# Patient Record
Sex: Female | Born: 1990 | Race: White | Hispanic: Yes | Marital: Single | Smoking: Never smoker
Health system: Southern US, Community
[De-identification: ages and names within clinical notes are randomized; demographics above are authoritative.]

## PROBLEM LIST (undated history)

## (undated) ENCOUNTER — Emergency Department (HOSPITAL_COMMUNITY): Payer: No Typology Code available for payment source | Source: Home / Self Care

## (undated) HISTORY — PX: OTHER SURGICAL HISTORY: SHX169

---
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
## 2012-09-02 ENCOUNTER — Encounter (HOSPITAL_COMMUNITY): Payer: Self-pay | Admitting: Emergency Medicine

## 2012-09-02 ENCOUNTER — Emergency Department (HOSPITAL_COMMUNITY)
Admission: EM | Admit: 2012-09-02 | Discharge: 2012-09-02 | Disposition: A | Discharge status: Home / Self Care | Payer: Self-pay | Attending: Emergency Medicine | Admitting: Emergency Medicine

## 2012-09-02 DIAGNOSIS — N644 Mastodynia: Secondary | ICD-10-CM | POA: Insufficient documentation

## 2012-09-02 DIAGNOSIS — R079 Chest pain, unspecified: Secondary | ICD-10-CM | POA: Insufficient documentation

## 2012-09-02 DIAGNOSIS — Z3202 Encounter for pregnancy test, result negative: Secondary | ICD-10-CM | POA: Insufficient documentation

## 2012-09-02 LAB — POCT PREGNANCY, URINE: Preg Test, Ur: NEGATIVE

## 2012-09-02 MED ORDER — IBUPROFEN 800 MG PO TABS: 800.0000 mg | ORAL_TABLET | Freq: Three times a day (TID) | ORAL | Status: AC | PRN
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
## 2012-09-02 NOTE — ED Notes (Signed)
Pt c/o pain below left breast onset this am.

## 2012-09-02 NOTE — ED Notes (Signed)
C/o L breast pain that is worse with palpation since waking up at 8am.  Denies any other symptoms.

## 2012-09-03 NOTE — ED Provider Notes (Signed)
Medical screening examination/treatment/procedure(s) were performed by non-physician practitioner and as supervising physician I was immediately available for consultation/collaboration.  Dawnmarie Breon, MD 09/03/12 1425 

## 2015-03-03 ENCOUNTER — Other Ambulatory Visit: Payer: Self-pay | Admitting: Infectious Disease

## 2015-03-03 ENCOUNTER — Ambulatory Visit
Admission: RE | Admit: 2015-03-03 | Discharge: 2015-03-03 | Disposition: A | Discharge status: Home / Self Care | Payer: No Typology Code available for payment source | Source: Ambulatory Visit | Attending: Infectious Disease | Admitting: Infectious Disease

## 2015-03-03 DIAGNOSIS — R7611 Nonspecific reaction to tuberculin skin test without active tuberculosis: Secondary | ICD-10-CM

## 2019-01-11 ENCOUNTER — Other Ambulatory Visit: Payer: Self-pay

## 2019-01-11 DIAGNOSIS — Z20822 Contact with and (suspected) exposure to covid-19: Secondary | ICD-10-CM

## 2019-01-12 LAB — NOVEL CORONAVIRUS, NAA: SARS-CoV-2, NAA: NOT DETECTED

## 2019-03-05 ENCOUNTER — Emergency Department (HOSPITAL_BASED_OUTPATIENT_CLINIC_OR_DEPARTMENT_OTHER)
Admission: EM | Admit: 2019-03-05 | Discharge: 2019-03-05 | Disposition: A | Discharge status: Home / Self Care | Payer: Self-pay | Attending: Emergency Medicine | Admitting: Emergency Medicine

## 2019-03-05 ENCOUNTER — Other Ambulatory Visit: Payer: Self-pay

## 2019-03-05 ENCOUNTER — Encounter (HOSPITAL_BASED_OUTPATIENT_CLINIC_OR_DEPARTMENT_OTHER): Payer: Self-pay | Admitting: Emergency Medicine

## 2019-03-05 DIAGNOSIS — M545 Low back pain: Secondary | ICD-10-CM | POA: Diagnosis not present

## 2019-03-05 DIAGNOSIS — Y92410 Unspecified street and highway as the place of occurrence of the external cause: Secondary | ICD-10-CM | POA: Insufficient documentation

## 2019-03-05 DIAGNOSIS — Y999 Unspecified external cause status: Secondary | ICD-10-CM | POA: Insufficient documentation

## 2019-03-05 DIAGNOSIS — R1030 Lower abdominal pain, unspecified: Secondary | ICD-10-CM | POA: Insufficient documentation

## 2019-03-05 DIAGNOSIS — Z3202 Encounter for pregnancy test, result negative: Secondary | ICD-10-CM | POA: Diagnosis not present

## 2019-03-05 DIAGNOSIS — Y9389 Activity, other specified: Secondary | ICD-10-CM | POA: Diagnosis not present

## 2019-03-05 DIAGNOSIS — M7918 Myalgia, other site: Secondary | ICD-10-CM

## 2019-03-05 LAB — URINALYSIS, MICROSCOPIC (REFLEX)

## 2019-03-05 LAB — URINALYSIS, ROUTINE W REFLEX MICROSCOPIC
Bilirubin Urine: NEGATIVE
Glucose, UA: NEGATIVE mg/dL
Ketones, ur: NEGATIVE mg/dL
Leukocytes,Ua: NEGATIVE
Nitrite: NEGATIVE
Protein, ur: NEGATIVE mg/dL
Specific Gravity, Urine: 1.025 (ref 1.005–1.030)
pH: 6 (ref 5.0–8.0)

## 2019-03-05 LAB — PREGNANCY, URINE: Preg Test, Ur: NEGATIVE

## 2019-03-05 MED ORDER — METHOCARBAMOL 500 MG PO TABS: 1000.0000 mg | ORAL_TABLET | Freq: Four times a day (QID) | ORAL | 0 refills | Status: AC
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
## 2019-09-10 ENCOUNTER — Ambulatory Visit: Payer: Self-pay | Attending: Internal Medicine

## 2019-09-10 DIAGNOSIS — Z23 Encounter for immunization: Secondary | ICD-10-CM

## 2019-09-10 NOTE — Progress Notes (Signed)
   Covid-19 Vaccination Clinic  Name:  Jennifer Hudson    MRN: [id number] DOB: 06/20/1990  09/10/2019  Ms. Jennifer Hudson was observed post Covid-19 immunization for 15 minutes without incident. She was provided with Vaccine Information Sheet and instruction to access the V-Safe system.   Ms. Jennifer Hudson was instructed to call 911 with any severe reactions post vaccine: Marland Kitchen Difficulty breathing  . Swelling of face and throat  . A fast heartbeat  . A bad rash all over body  . Dizziness and weakness   Immunizations Administered    Name Date Dose VIS Date Route   Pfizer COVID-19 Vaccine 09/10/2019  1:21 PM 0.3 mL 05/28/2019 Intramuscular   Manufacturer: [employer], [employer]   Lot: [id number]   NDC: [address]-2

## 2019-10-01 ENCOUNTER — Ambulatory Visit: Payer: Self-pay | Attending: Internal Medicine

## 2019-10-01 ENCOUNTER — Ambulatory Visit: Payer: Self-pay

## 2019-10-01 DIAGNOSIS — Z23 Encounter for immunization: Secondary | ICD-10-CM

## 2019-10-01 NOTE — Progress Notes (Signed)
   Covid-19 Vaccination Clinic  Name:  Jennifer Hudson    MRN: [id number] DOB: April 12, 1991  10/01/2019  Ms. [name] was observed post Covid-19 immunization for 15 minutes without incident. She was provided with Vaccine Information Sheet and instruction to access the V-Safe system.   Ms. [name] was instructed to call 911 with any severe reactions post vaccine: Marland Kitchen Difficulty breathing  . Swelling of face and throat  . A fast heartbeat  . A bad rash all over body  . Dizziness and weakness   Immunizations Administered    Name Date Dose VIS Date Route   Pfizer COVID-19 Vaccine 10/01/2019  2:20 PM 0.3 mL 05/28/2019 Intramuscular   Manufacturer: [employer], [employer]   Lot: [id number]   NDC: [address]-4

## 2020-02-15 ENCOUNTER — Other Ambulatory Visit: Payer: Self-pay

## 2020-02-15 ENCOUNTER — Encounter (HOSPITAL_COMMUNITY): Payer: Self-pay

## 2020-02-15 ENCOUNTER — Emergency Department (HOSPITAL_COMMUNITY)
Admission: EM | Admit: 2020-02-15 | Discharge: 2020-02-15 | Disposition: A | Discharge status: Home / Self Care | Payer: Self-pay | Attending: Emergency Medicine | Admitting: Emergency Medicine

## 2020-02-15 DIAGNOSIS — J069 Acute upper respiratory infection, unspecified: Secondary | ICD-10-CM | POA: Insufficient documentation

## 2020-02-15 DIAGNOSIS — R638 Other symptoms and signs concerning food and fluid intake: Secondary | ICD-10-CM | POA: Insufficient documentation

## 2020-02-15 DIAGNOSIS — R509 Fever, unspecified: Secondary | ICD-10-CM | POA: Insufficient documentation

## 2020-02-15 NOTE — ED Provider Notes (Signed)
[facility]-EMERGENCY DEPT Provider Note   CSN: [id number] Arrival date & time: 02/15/20  1436     History No chief complaint on file.   Jennifer Hudson is a 29 y.o. female.  HPI Patient presents with URI symptoms. Has had for around 2 days. Got seen at the health department at [facility] for a test. States the test not back yet. Does have possible Covid exposure. Has had her vaccine more than 2 weeks ago. Mild sore throat. Some chills. States she has lost her sense of taste. Increasing fatigue. States she wants to sleep a lot. Denies possibly of pregnancy. Denies dysuria. States she is healthy and exercises.    History reviewed. No pertinent past medical history.  There are no problems to display for this patient.   Past Surgical History:  Procedure Laterality Date  . toe nail removal       OB History   No obstetric history on file.     Family History  Problem Relation Age of Onset  . Hypertension Mother   . Diabetes Mother   . Hypertension Father   . Diabetes Father     Social History   Tobacco Use  . Smoking status: Never Smoker  . Smokeless tobacco: Never Used  Vaping Use  . Vaping Use: Never used  Substance Use Topics  . Alcohol use: Yes    Comment: occasionally  . Drug use: Yes    Types: Marijuana    Home Medications Prior to Admission medications   Medication Sig Start Date End Date Taking? Authorizing Provider  ibuprofen (ADVIL,MOTRIN) 800 MG tablet Take 1 tablet (800 mg total) by mouth every 8 (eight) hours as needed for pain. 09/02/12   Fayrene Helper, PA-C  methocarbamol (ROBAXIN) 500 MG tablet Take 2 tablets (1,000 mg total) by mouth 4 (four) times daily. 03/05/19   Renne Crigler, PA-C    Allergies    Patient has no known allergies.  Review of Systems   Review of Systems  Constitutional: Positive for appetite change, fatigue and fever.  HENT: Negative for congestion.   Respiratory: Positive for cough.   Gastrointestinal:  Negative for abdominal pain, diarrhea, nausea and vomiting.  Genitourinary: Negative for flank pain.  Musculoskeletal: Negative for back pain.  Neurological: Negative for weakness.  Psychiatric/Behavioral: Negative for confusion.    Physical Exam Updated Vital Signs BP 130/81 (BP Location: Right Arm)   Pulse (!) 101   Temp 98 F (36.7 C) (Oral)   Resp 20   Ht 5\' 1"  (1.549 m)   Wt 69.9 kg   LMP 11/15/2019 Comment: Patient has an irregular period  SpO2 99%   BMI 29.10 kg/m   Physical Exam Vitals and nursing note reviewed.  HENT:     Head: Normocephalic.     Mouth/Throat:     Comments: Mild posterior pharyngeal erythema without exudate. Cardiovascular:     Comments: Mild tachycardia Pulmonary:     Effort: Pulmonary effort is normal.     Breath sounds: Normal breath sounds. No wheezing or rhonchi.  Abdominal:     Tenderness: There is no abdominal tenderness.  Musculoskeletal:        General: No tenderness.     Cervical back: Neck supple.  Skin:    General: Skin is warm.  Neurological:     Mental Status: She is alert and oriented to person, place, and time.     ED Results / Procedures / Treatments   Labs (all labs ordered are listed,  but only abnormal results are displayed) Labs Reviewed - No data to display  EKG None  Radiology No results found.  Procedures Procedures (including critical care time)  Medications Ordered in ED Medications - No data to display  ED Course  I have reviewed the triage vital signs and the nursing notes.  Pertinent labs & imaging results that were available during my care of the patient were reviewed by me and considered in my medical decision making (see chart for details).    MDM Rules/Calculators/A&P                          Patient with URI symptoms. Has had outpatient Covid test but not resulted. Having more fatigue. Lungs clear. Doubt focal bacterial pneumonia. Potentially could be Covid infection although testing has  been done results are not back yet. Not think she needs a separate test here. Will treat as if she has Covid now with isolation. Will discharge home. Potentially could have other viral infection but treatment is the same. Final Clinical Impression(s) / ED Diagnoses Final diagnoses:  Upper respiratory tract infection, unspecified type    Rx / DC Orders ED Discharge Orders    None       Benjiman Core, MD 02/15/20 1747
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
## 2020-02-15 NOTE — Discharge Instructions (Addendum)
You likely have a viral infection.  It could be Covid.  Isolate yourself now like it is an known Covid infection.  Return for worsening difficulty breathing.  Follow-up with the health department for the results of your test.

## 2021-08-11 ENCOUNTER — Emergency Department (HOSPITAL_COMMUNITY): Payer: Self-pay

## 2021-08-11 ENCOUNTER — Emergency Department (HOSPITAL_COMMUNITY)
Admission: EM | Admit: 2021-08-11 | Discharge: 2021-08-12 | Disposition: A | Discharge status: Home / Self Care | Payer: Self-pay | Attending: Emergency Medicine | Admitting: Emergency Medicine

## 2021-08-11 DIAGNOSIS — Y93E8 Activity, other personal hygiene: Secondary | ICD-10-CM | POA: Insufficient documentation

## 2021-08-11 DIAGNOSIS — S61411A Laceration without foreign body of right hand, initial encounter: Secondary | ICD-10-CM | POA: Insufficient documentation

## 2021-08-11 DIAGNOSIS — W25XXXA Contact with sharp glass, initial encounter: Secondary | ICD-10-CM | POA: Insufficient documentation

## 2021-08-11 NOTE — ED Triage Notes (Signed)
Pt c/o lac to R hand between thumb & index finger, states she was cleaning out a bag of clothes & cut it on broken glass. Bleeding controlled in triage

## 2021-08-12 MED ORDER — LIDOCAINE-EPINEPHRINE-TETRACAINE (LET) TOPICAL GEL
3.0000 mL | Freq: Once | TOPICAL | Status: AC
Start: 2021-08-12 — End: 2021-08-12
  Administered 2021-08-12: 3 mL via TOPICAL
  Filled 2021-08-12: qty 3

## 2021-08-12 NOTE — ED Provider Notes (Signed)
[facility] EMERGENCY DEPARTMENT Provider Note   CSN: [id number] Arrival date & time: 08/11/21  2312     History  Chief Complaint  Patient presents with   Laceration    Jennifer Hudson is a 31 y.o. female.   Laceration Location:  Hand Hand laceration location: ~1 cm laceration between thumb and pointer finger, hemostatic. Length:  1 cm Depth:  Cutaneous Quality: jagged   Bleeding: controlled   Laceration mechanism:  Broken glass Pain details:    Quality:  Aching   Severity:  Mild   Timing:  Constant   Progression:  Improving Foreign body present:  No foreign bodies Relieved by:  Nothing Worsened by:  Nothing Ineffective treatments:  None tried Tetanus status:  Up to date     Home Medications Prior to Admission medications   Medication Sig Start Date End Date Taking? Authorizing Provider  ibuprofen (ADVIL,MOTRIN) 800 MG tablet Take 1 tablet (800 mg total) by mouth every 8 (eight) hours as needed for pain. 09/02/12   Fayrene Helper, PA-C  methocarbamol (ROBAXIN) 500 MG tablet Take 2 tablets (1,000 mg total) by mouth 4 (four) times daily. 03/05/19   Renne Crigler, PA-C      Allergies    Patient has no known allergies.    Review of Systems   Review of Systems  Physical Exam Updated Vital Signs BP (!) 131/102 (BP Location: Left Arm)    Pulse 73    Temp 98.6 F (37 C) (Oral)    Resp 18    SpO2 99%  Physical Exam Vitals and nursing note reviewed.  Constitutional:      Appearance: She is well-developed.  HENT:     Head: Normocephalic and atraumatic.     Nose: Nose normal. No congestion or rhinorrhea.     Mouth/Throat:     Mouth: Mucous membranes are moist.  Eyes:     Pupils: Pupils are equal, round, and reactive to light.  Cardiovascular:     Rate and Rhythm: Normal rate and regular rhythm.  Pulmonary:     Effort: No respiratory distress.     Breath sounds: No stridor.  Abdominal:     General: Abdomen is flat. There is no  distension.  Musculoskeletal:        General: No swelling or tenderness. Normal range of motion.     Cervical back: Normal range of motion.  Skin:    General: Skin is warm and dry.     Comments: ~1 cm laceration between thumb and pointer finger, hemostatic  Neurological:     General: No focal deficit present.     Mental Status: She is alert.    ED Results / Procedures / Treatments   Labs (all labs ordered are listed, but only abnormal results are displayed) Labs Reviewed - No data to display  EKG None  Radiology DG Hand Complete Right  Result Date: 08/11/2021 CLINICAL DATA:  Right hand injury. EXAM: RIGHT HAND - COMPLETE 3+ VIEW COMPARISON:  None. FINDINGS: There is no acute fracture or dislocation. 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 No arthritic changes. The soft tissues are unremarkable. No radiopaque foreign object or soft tissue gas. IMPRESSION: No acute fracture or dislocation. Electronically Signed   By: Elgie Collard M.D.   On: 08/11/2021 23:46    Procedures .Marland KitchenLaceration Repair  Date/Time: 08/12/2021 4:20 AM Performed by: Marily Memos, MD Authorized by: Marily Memos, MD   Consent:    Consent obtained:  Verbal   Consent given by:  Patient   Risks discussed:  Infection, need for additional repair, nerve damage, poor wound healing, poor cosmetic result, pain, retained foreign body, tendon damage and vascular damage   Alternatives discussed:  No treatment, delayed treatment and observation Universal protocol:    Procedure explained and questions answered to patient or proxy's satisfaction: yes     Relevant documents present and verified: yes     Site/side marked: yes     Patient identity confirmed:  Hospital-assigned identification number and arm band Anesthesia:    Anesthesia method:  Topical application   Topical anesthetic:  LET Laceration details:    Location:  Hand   Hand location:  R palm   Length (cm):  1   Depth (mm):  3 Pre-procedure details:     Preparation:  Patient was prepped and draped in usual sterile fashion and imaging obtained to evaluate for foreign bodies Exploration:    Wound exploration: wound explored through full range of motion   Treatment:    Area cleansed with:  Saline   Amount of cleaning:  Extensive   Irrigation solution:  Sterile water   Irrigation method:  Syringe   Debridement:  None   Scar revision: no   Skin repair:    Repair method:  Sutures   Suture size:  4-0   Suture material:  Prolene   Suture technique:  Simple interrupted   Number of sutures:  2 Approximation:    Approximation:  Close Repair type:    Repair type:  Simple Post-procedure details:    Dressing:  Antibiotic ointment, non-adherent dressing, tube gauze and bulky dressing   Procedure completion:  Tolerated well, no immediate complications    Medications Ordered in ED Medications  lidocaine-EPINEPHrine-tetracaine (LET) topical gel (3 mLs Topical Given 08/12/21 0150)    ED Course/ Medical Decision Making/ A&P                           Medical Decision Making Amount and/or Complexity of Data Reviewed Radiology: ordered.   XR without foreign body. Wound repaired as above. States tetanus UTD. Will return for suture removal or earlier if signs of infection.   Final Clinical Impression(s) / ED Diagnoses Final diagnoses:  Laceration of right hand, foreign body presence unspecified, initial encounter    Rx / DC Orders ED Discharge Orders     None         Brendan Gadson, Barbara Cower, MD 08/12/21 [phone]

## 2023-09-25 IMAGING — CR DG HAND COMPLETE 3+V*R*
3 series · 3 of 3 positions shown · non-contrast
Comparison: None.

CLINICAL DATA: Right hand injury.

EXAM:
RIGHT HAND - COMPLETE 3+ VIEW

[hand pa]
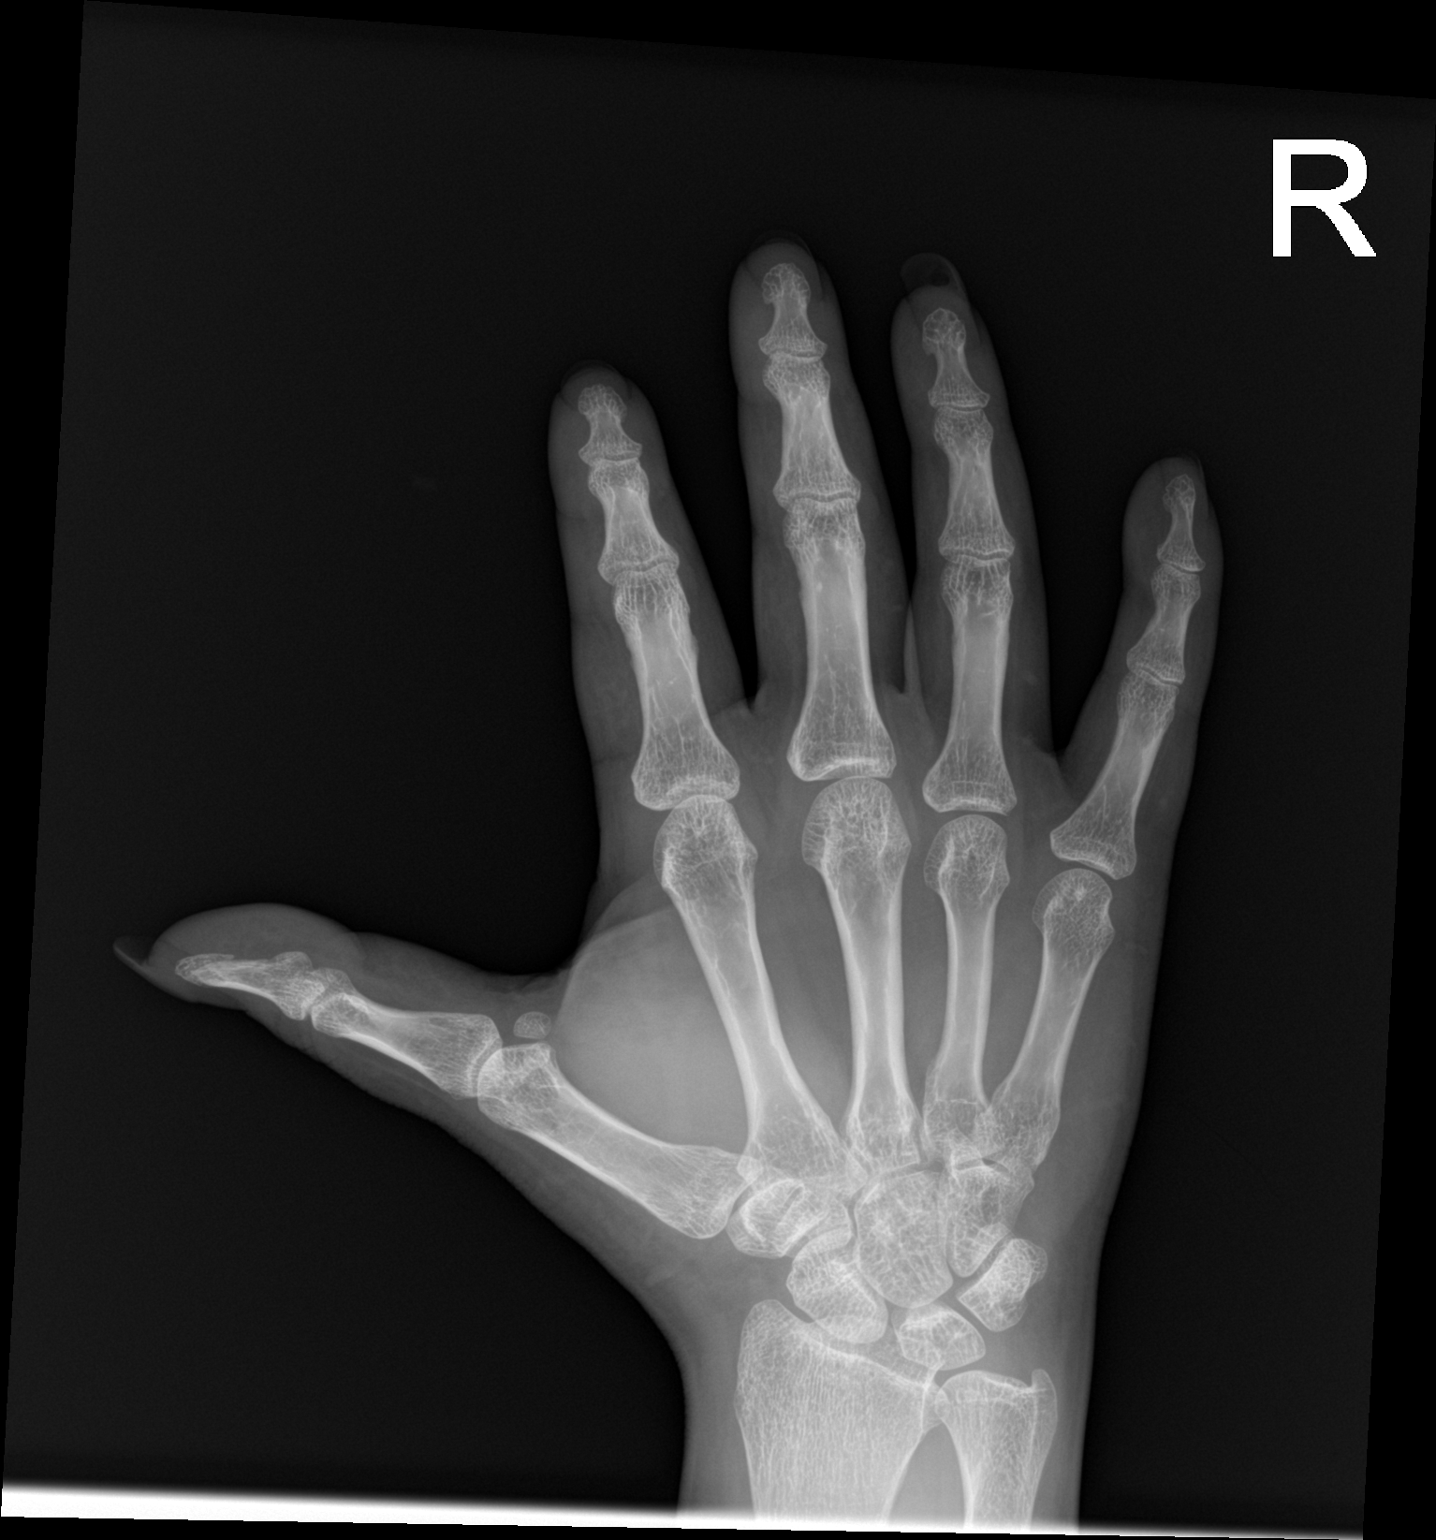

[hand obl]
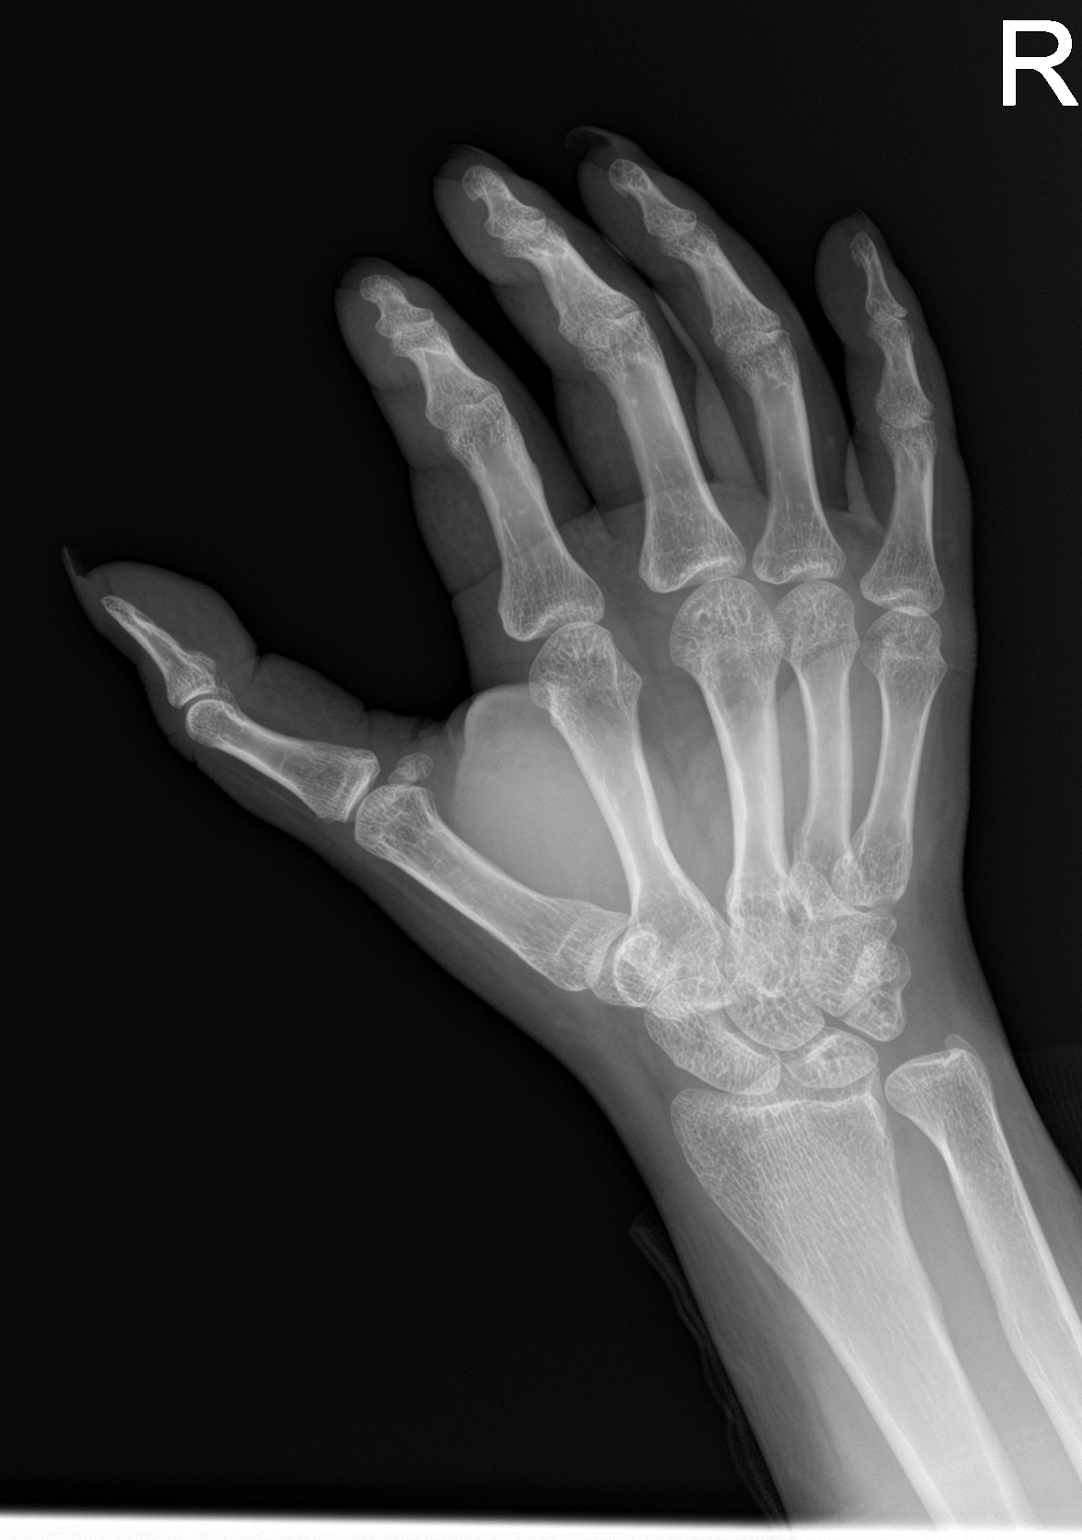

[hand lat]
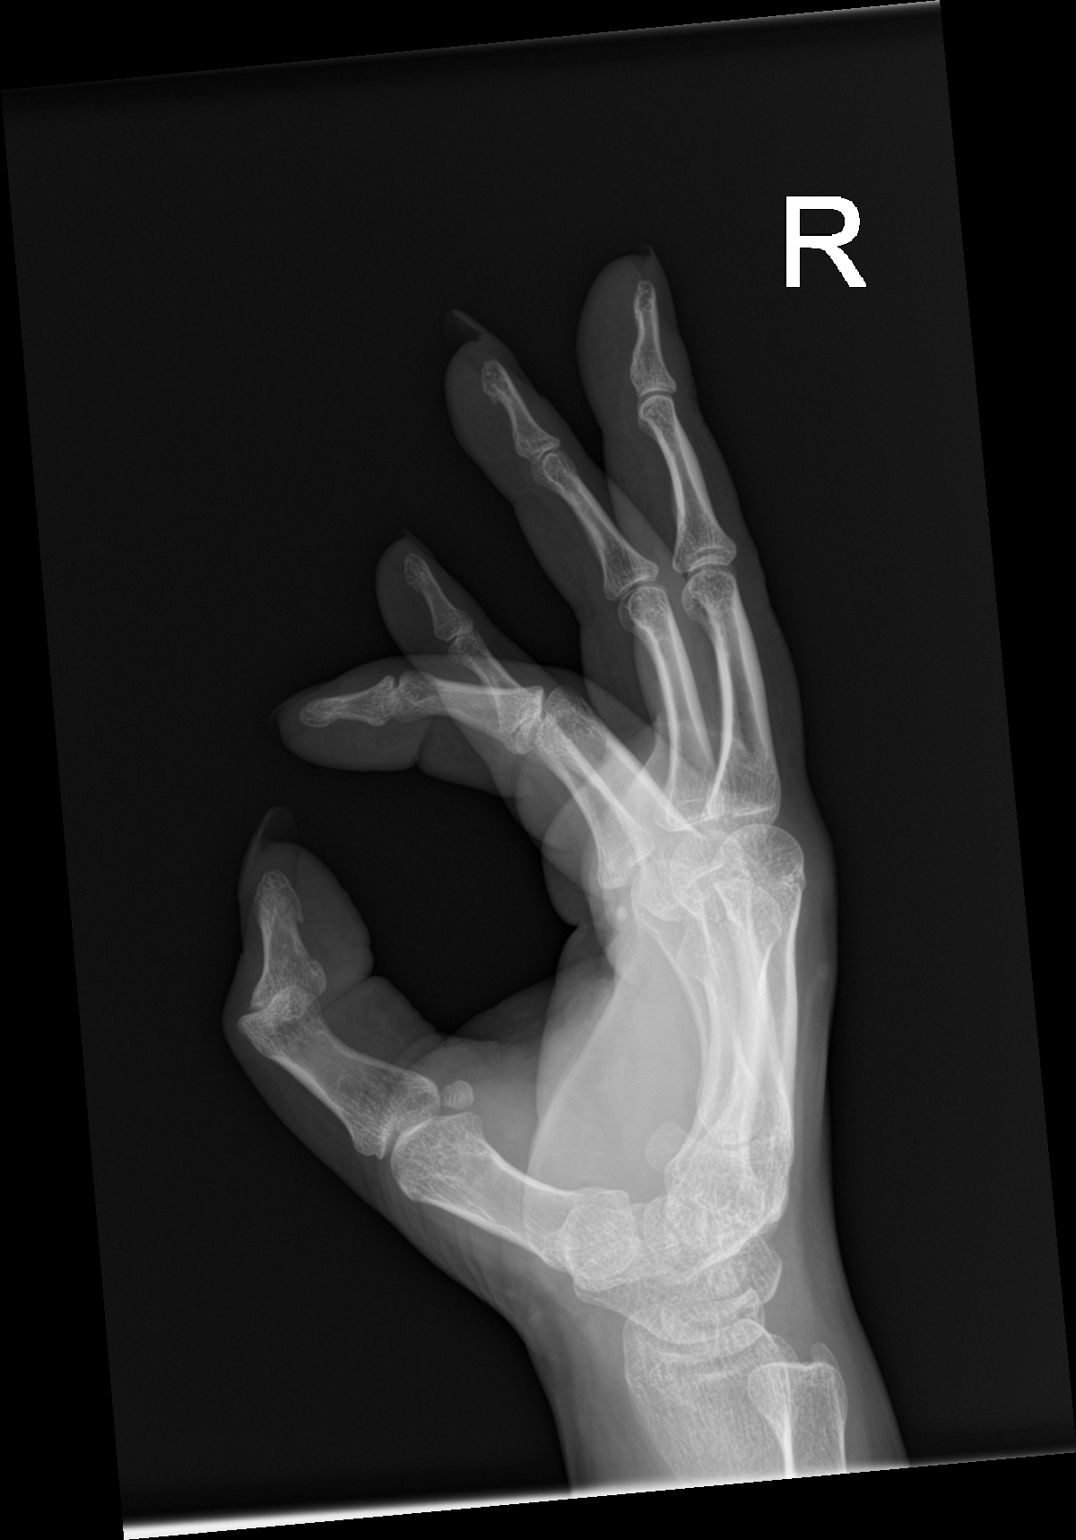

[3 of 3 positions shown; findings below may reference images not displayed]

FINDINGS: There is no acute fracture or dislocation. There is juxta-articular
osteopenia. No arthritic changes. The soft tissues are unremarkable.
No radiopaque foreign object or soft tissue gas.
IMPRESSION: No acute fracture or dislocation.
